# Patient Record
Sex: Male | Born: 1945 | Race: White | Hispanic: No | Marital: Married
Health system: Southern US, Community
[De-identification: ages and names within clinical notes are randomized; demographics above are authoritative.]

---

## 2006-07-27 ENCOUNTER — Encounter (INDEPENDENT_AMBULATORY_CARE_PROVIDER_SITE_OTHER): Payer: Self-pay | Admitting: Specialist

## 2006-07-27 ENCOUNTER — Inpatient Hospital Stay (HOSPITAL_COMMUNITY): Admission: RE | Admit: 2006-07-27 | Discharge: 2006-07-29 | Payer: Self-pay | Admitting: Urology

## 2008-03-10 IMAGING — CR DG CHEST 2V
2 series · 2 of 2 positions shown · non-contrast
Comparison: None.

CLINICAL DATA: Prostate cancer. Preop respiratory exam.
 CHEST - 2 VIEW:

[w chest pa]
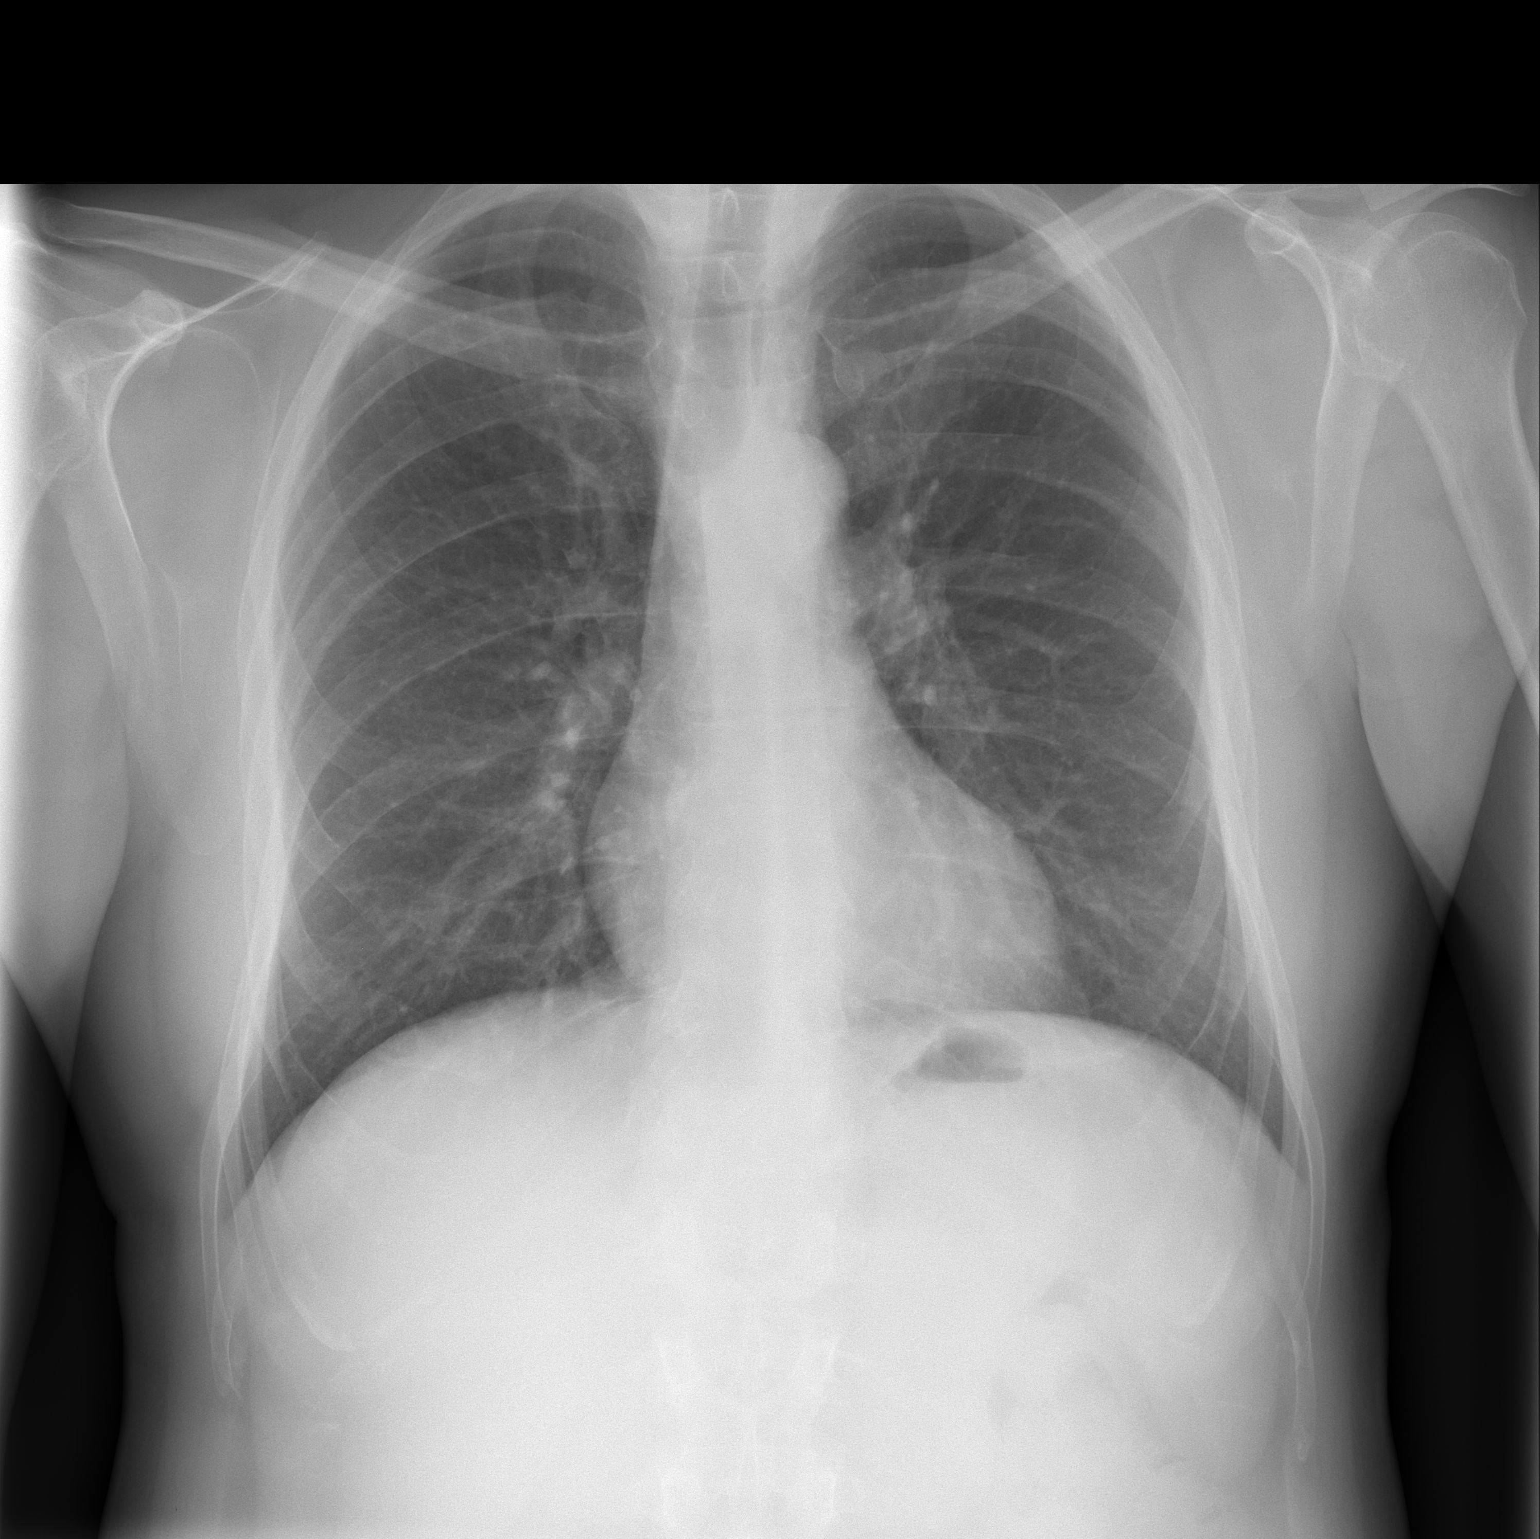

[w chest lat]
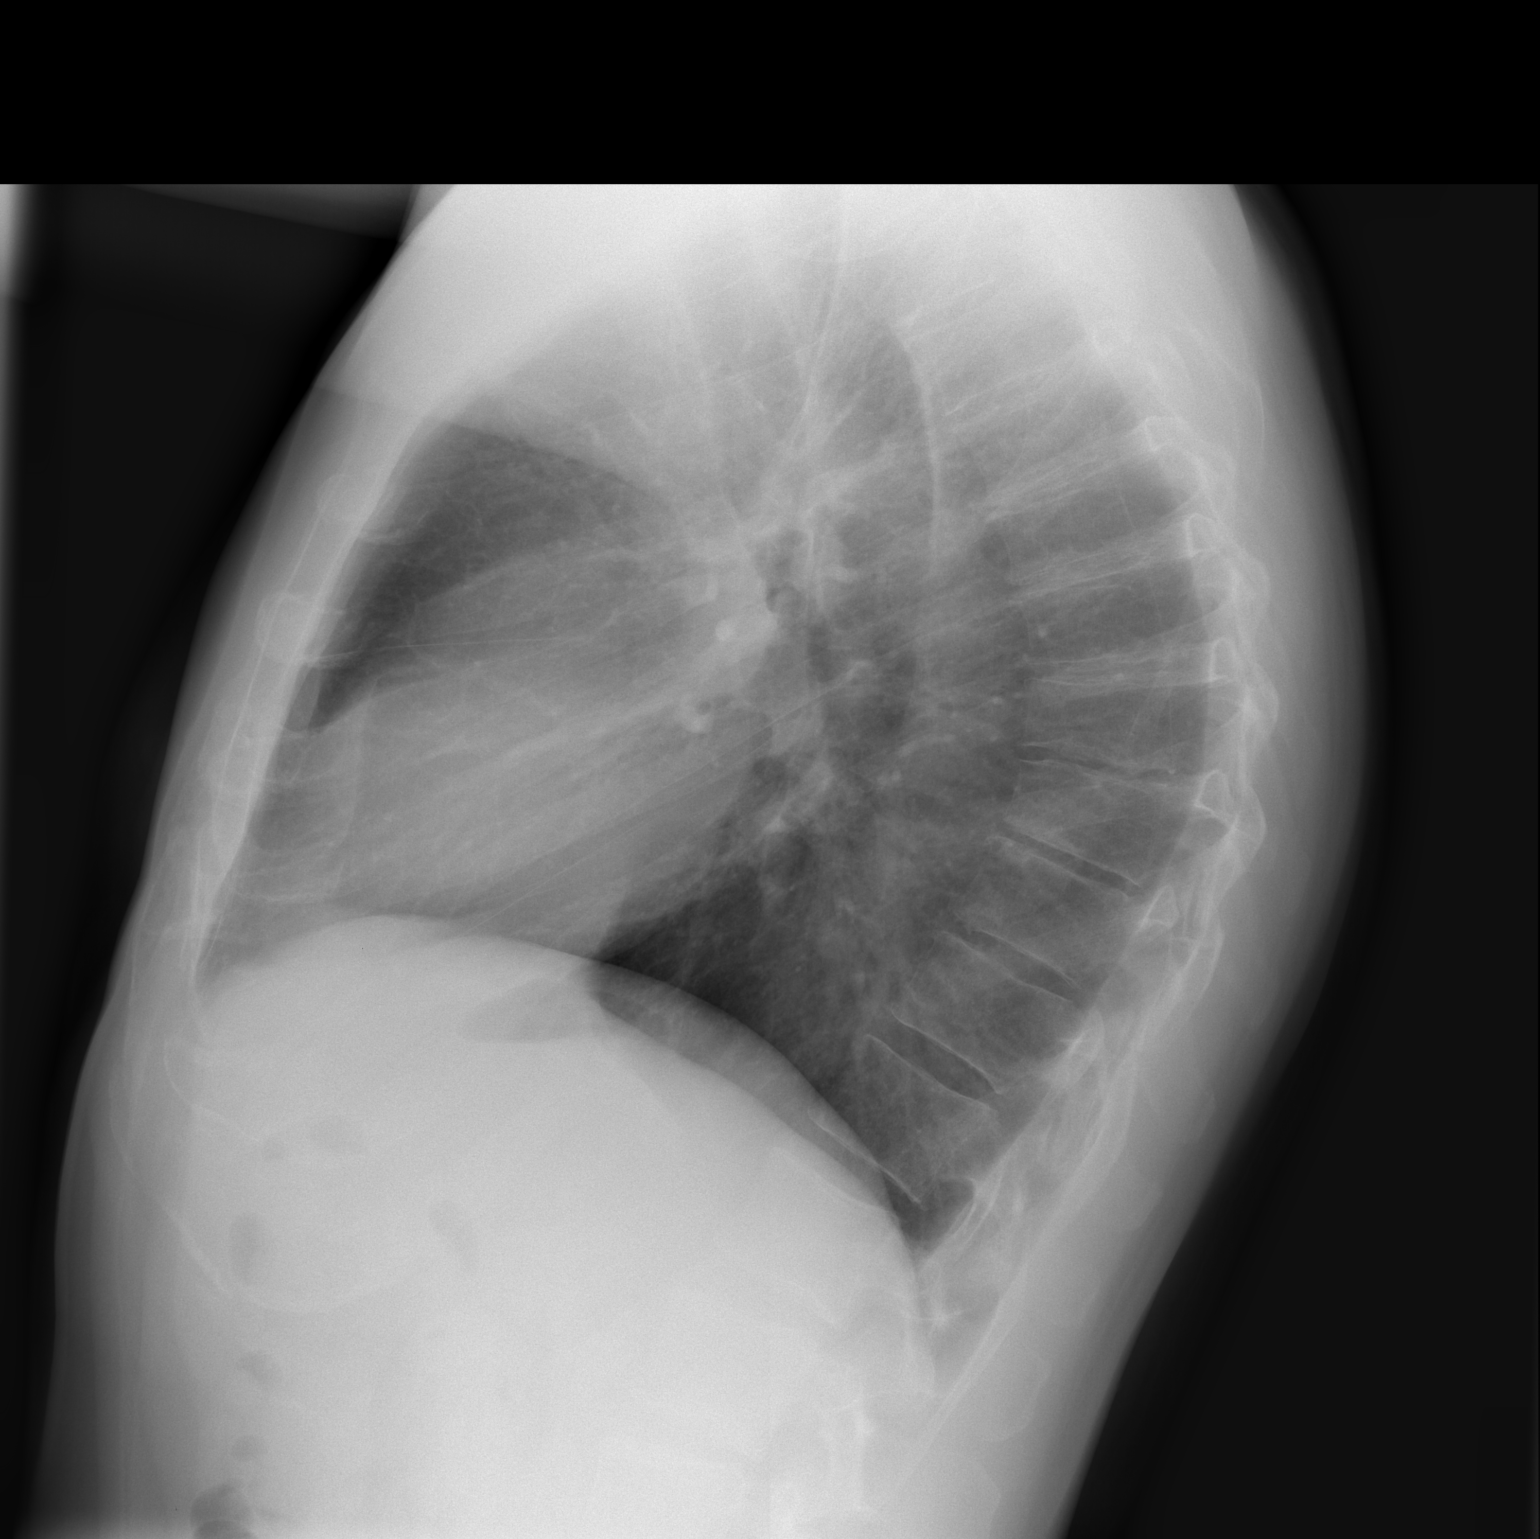

[2 of 2 positions shown; findings below may reference images not displayed]

FINDINGS: The cardiomediastinal contours are normal.  The lungs are clear.  There is no pleural effusion.  Mild anterior wedging of 2 adjacent thoracic vertebral bodies is demonstrated.  There is no evidence of fracture or metastatic disease.
IMPRESSION: No active cardiopulmonary process demonstrated.

## 2008-11-29 ENCOUNTER — Ambulatory Visit (HOSPITAL_BASED_OUTPATIENT_CLINIC_OR_DEPARTMENT_OTHER): Admission: RE | Admit: 2008-11-29 | Discharge: 2008-11-29 | Payer: Self-pay | Admitting: Orthopedic Surgery

## 2010-12-24 LAB — ANAEROBIC CULTURE

## 2010-12-24 LAB — TISSUE CULTURE

## 2011-01-26 NOTE — Op Note (Signed)
Matthew Huffman, Matthew Huffman          ACCOUNT NO.:  [id number]   MEDICAL RECORD NO.:  [id number]          PATIENT TYPE:  AMB   LOCATION:  DSC                          FACILITY:  [facility]   PHYSICIAN:  Katy Fitch. Sypher, M.D. DATE OF BIRTH:  Sep 24, 1945   DATE OF PROCEDURE:  11/29/2008  DATE OF DISCHARGE:                               OPERATIVE REPORT   PREOPERATIVE DIAGNOSES:  Chronic infected/ingrown right thumb nail,  radial nail wall with pain, odor, and drainage with chronic nail lysis  of nail plate on radial aspect of sterile nail matrix.   POSTOPERATIVE DIAGNOSES:  Ingrown nail with nail matrix deformity and  chronic subungual infection.   OPERATION:  1. Resection of the radial 3 mm of nail plate followed by aerobic and      anaerobic culture of granulation tissue deep to nail plate and      radial nail wall.  2. Permanent resection of germinal nail matrix, radial aspect of      distal phalanx and radial nail wall to permanently narrow the right      thumb nail.   OPERATING SURGEON:  Katy Fitch. Sypher, MD   ASSISTANT:  Marveen Reeks Dasnoit, PA-C   ANESTHESIA:  Lidocaine 2% metacarpal head level block without  supplemental sedation.  This was a minor operating room procedure  performed in the [facility] OR.   ANESTHETIST:  Sypher.   INDICATIONS:  Matthew Huffman is a 65 year old gentleman with a  history of a chronic swelling and painful right thumb nail.  He had  chronic lysis of the nail plate with an area of onycholysis and chronic  rubor and swelling along the radial nail wall.  He noted malodorous  drainage and chronic discomfort.  He could not recall specific injury.   He had been seeing a dermatologist and had been treating with Lamisil  without success.   We advised him to proceed with a nail debridement, aerobic and anaerobic  culture, and permanent correction of his ingrown nail predicament.   Preoperatively, he was advised the anticipated procedure,  potential  risks and benefits.  After informed consent, he is brought to the  operating room at this time.   PROCEDURE:  Matthew Huffman was brought to the operating room and  placed in the supine position upon the operating table.   Following Betadine prep, the right thumb was anesthetized with  metacarpal head level block of 2% plain lidocaine.   After 5 minutes, he had complete anesthesia.   The right arm was prepped with Betadine soap solution and sterilely  draped.  The right thumb was exsanguinated with a gauze wrap and a 1/2-  inch Penrose drain placed as a digital tourniquet.   The procedure commenced with resection of the radial 3 mm of nail plate  followed by curettage of the radial nail wall.  Extensive granulation  tissue was recovered.  This was sent off for aerobic and anaerobic  growth.   The wound was then carefully inspected and there was noted to be a  permanent deformity of the radial nail matrix deep to the dorsal nail  fold causing a nail plate fragment to grow into the nail wall.   The dorsal nail fold was split 5 mm proximally and a complete resection  of the dorsal intermediate and ventral nail fold was accomplished along  the radial nail wall permanently narrowing the nail by 2-2.5 mm.  This  was resected down to the periosteum of the distal phalanx and extensor  tendon.  The wound was then debrided, packed open with Xeroflo and the  dorsal nail fold repaired with interrupted suture of 6-0 chromic.   The thumb was dressed with Xeroflo sterile gauze and Coban.  There were  no apparent complications.  Mr. Matthew Huffman tolerated the surgery and  anesthesia well.  He was transferred to recovery room with stable vital  signs.   He will be discharged to the care of his wife with prescription for  Tylenol with Codeine No.3.  As this was an open wound, we were not  providing antibiotics at this time.       Katy Fitch Sypher, M.D.  Electronically  Signed     RVS/MEDQ  D:  11/29/2008  T:  11/30/2008  Job:  [id number]

## 2011-01-29 NOTE — H&P (Signed)
Matthew Huffman, Matthew Huffman          ACCOUNT NO.:  [id number]   MEDICAL RECORD NO.:  [id number]          PATIENT TYPE:  INP   LOCATION:  NA                           FACILITY:  [facility]   PHYSICIAN:  Heloise Purpura, MD      DATE OF BIRTH:  Jul 09, 1946   DATE OF ADMISSION:  07/27/2006  DATE OF DISCHARGE:                                HISTORY & PHYSICAL   CHIEF COMPLAINT:  Prostate cancer.   HISTORY:  Matthew Huffman is a 65 year old gentleman with clinical stage T1-C  prostate cancer with a PSA of 3.6 and Gleason score of 3 + 3 = 6.  After  discussion regarding management options for clinically localized prostate  cancer, the patient elected to proceed with surgical therapy and decided to  have a robotic-assisted laparoscopic radical prostatectomy.   PAST MEDICAL HISTORY:  1. Hypertension.  2. Gastroesophageal reflux disease.   PAST SURGICAL HISTORY:  Circumcision.   MEDICATIONS:  1. Famotidine.  2. Benicar.  3. Fluticasone propionate.  4. Baby aspirin.  5. Fish oil.  6. Niacin.  7. Cinnamon.   ALLERGIES:  No known drug allergies.   FAMILY HISTORY:  Positive for diabetes, hypertension and coronary artery  disease.  There is no history of prostate cancer or GU malignancy in the  family.   SOCIAL HISTORY:  The patient is retired.  He is married.  He denies history  of tobacco use.  He drinks one glass of red wine per day.   PHYSICAL EXAMINATION:  CONSTITUTIONAL:  The patient is a well-nourished,  well-developed, age-appropriate male in no acute distress.  CARDIOVASCULAR:  Regular rate and rhythm without obvious murmurs.  LUNGS:  Clear bilaterally.  ABDOMEN:  Soft, nontender, nondistended without abdominal masses.  DIGITAL RECTAL EXAM:  No prostate nodularity or induration.   IMPRESSION:  Clinically localized prostate cancer.   PLAN:  Matthew Huffman will undergo a robotic-assisted laparoscopic radical  prostatectomy.  He will then be admitted to the hospital for routine  postoperative care.           ______________________________  Heloise Purpura, MD  Electronically Signed     LB/MEDQ  D:  07/26/2006  T:  07/27/2006  Job:  [id number]

## 2011-01-29 NOTE — Discharge Summary (Signed)
Matthew Huffman, SLEIGHT          ACCOUNT NO.:  [id number]   MEDICAL RECORD NO.:  [id number]          PATIENT TYPE:  INP   LOCATION:  1410                         FACILITY[facility]   PHYSICIAN:  Heloise Purpura, MD      DATE OF BIRTH:  08/25/1946   DATE OF ADMISSION:  07/27/2006  DATE OF DISCHARGE:  07/29/2006                                 DISCHARGE SUMMARY   ADMISSION DIAGNOSIS:  Prostate cancer.   DISCHARGE DIAGNOSIS:  Prostate cancer.   HISTORY OF PRESENT ILLNESS:  For full details, please see admission history  and physical.  Briefly, Mr. Leedy is a 65 year old gentleman with  clinical stage T1c prostate cancer with a PSA of 3.6 and Gleason score of 3  + 3 = 6.  After discussing management options for clinically localized  prostate cancer, he elected to proceed with surgical therapy with a robotic-  assisted laparoscopic radical prostatectomy.   HOSPITAL COURSE:  The patient was taken to the operating room on July 27, 2006, and underwent a robotic-assisted laparoscopic radical  prostatectomy.  He tolerated this procedure well without complications.  Postoperatively, he was able to be transferred to a regular hospital room  following recovery from anesthesia.  He was able to begin ambulating the  evening of surgery.  He did have significant nausea throughout the first  postoperative night and well into the morning of postop day #2.  He was  unable to be transferred over to a full liquid diet at this point due to his  nausea.  By the afternoon of postop day #1, his nausea had resolved.  He  began having return of bowel function and was able to begin a clear liquid  diet.  He was monitored overnight on postop day #1 and continued to do okay  on a clear liquid diet.  He maintained excellent urine output and had  minimal output from his pelvic drain which was removed on the morning of  postop day #2.  He was therefore able to be discharged home in excellent  condition  that morning.   DISPOSITION:  Home.   DISCHARGE MEDICATIONS:  The patient was instructed to resume his regular  home medications except any aspirin, nonsteroidal anti-inflammatory drugs,  aspirin or herbal supplements.  He was given a prescription to take Vicodin  as needed for pain, told to take Colace as a stool softener and given a  prescription to begin Cipro 1 day prior to his return visit.   ACTIVITY:  The patient was instructed to be ambulatory, but specifically  told to refrain from any heavy lifting, strenuous activity or driving.  He  was told to gradually advance his diet once passing flatus.  He was also  given instruction on routine Foley catheter care and given a leg bag for  daytime usage.   FOLLOW UP:  Mr. Dimarco will follow up in 1 week for removal of his Foley  catheter.  I will plan to contact him with the results of his surgical  pathology.           ______________________________  Heloise Purpura, MD  Electronically  Signed     LB/MEDQ  D:  07/29/2006  T:  07/30/2006  Job:  [id number]

## 2011-01-29 NOTE — Op Note (Signed)
NAMETYEE, VANDEVOORDE NO.:  [id number]   MEDICAL RECORD NO.:  [id number]          PATIENT TYPE:  INP   LOCATION:  0010                         FACILITY:  [facility]   PHYSICIAN:  Heloise Purpura, MD      DATE OF BIRTH:  23-Mar-1946   DATE OF PROCEDURE:  07/27/2006  DATE OF DISCHARGE:                                 OPERATIVE REPORT   PREOPERATIVE DIAGNOSIS:  Clinically localized adenocarcinoma of prostate.   POSTOPERATIVE DIAGNOSIS:  Clinically localized adenocarcinoma of prostate.   PROCEDURE:  Robotic assisted laparoscopic radical prostatectomy (bilateral  nerve sparing).   SURGEON:  Heloise Purpura, M.D.   ASSISTANT:  Terie Purser, M.D.   ANESTHESIA:  General.   COMPLICATIONS:  None.   ESTIMATED BLOOD LOSS:  100 mL.   INTRAVENOUS FLUIDS:  2000 mL of lactated Ringer's.   SPECIMEN:  Prostate and seminal vesicles.   DRAINS:  1. #19 Blake pelvic drain.  2. 20-French straight catheter.   INDICATIONS:  Mr. Stallbaumer is a 65 year old gentleman with clinical stage  T1C prostate cancer with a PSA of 3.6 and Gleason score of 3+3=6.  Pretreatment IPSS score was 8 with an IIEF score of 23.  After discussing  management options for clinically localized prostate cancer, the patient  elected to proceed with surgical therapy and the above procedure.  The  potential risks and benefits were discussed with the patient and he  consented.   DESCRIPTION OF PROCEDURE:  The patient was taken to the operating room and a  general anesthetic was administered.  He was given preoperative antibiotics,  placed in the dorsal lithotomy position, prepped and draped in the usual  sterile fashion.  Next, a preoperative time-out was performed.  A Foley  catheter was then inserted into the bladder.  A site was then selected 18 cm  from the pubic symphysis and just to the left of the umbilicus for placement  of the camera port.  This was placed using a standard open Hassan technique.  This  allowed entry into the peritoneal cavity under direct vision.  A 12 mm  port was then placed and a pneumoperitoneum was established.  The 0 degrees  lens was used to inspect the abdomen and there was no evidence of any intra-  abdominal injuries or other abnormalities.  Attention was then turned to  placement of the remaining ports.  Bilateral 8 mm robotic ports were placed  16 cm from the pubic symphysis and 10 cm lateral to the camera port.  An  additional 8 mm port was placed in the far left lateral abdominal wall.  A 5  mm port was placed between the camera port and the right robotic port.  An  additional 12 mm port was placed in the far right lateral abdominal wall for  laparoscopic assistance.  All ports were placed under direct vision without  difficulty.  The surgical cart was then docked.   With the aid of the cautery scissors, the bladder was reflected posteriorly  allowing entry in the space of Retzius and identification of the endopelvic  fascia and prostate.  The endopelvic fascia was  then incised along the  length of the prostate from the apex back to the base and the underlying  levator muscle fibers were swept laterally off the prostate.  This isolated  the dorsal venous complex which was then stapled and divided with a 45 mm  flex ETS stapler.  The bladder neck was then identified with the aid of  Foley catheter manipulation.  The anterior bladder neck was incised,  thereby, exposing the Foley catheter.  The catheter balloon was then  deflated and the catheter was brought into the operative field and used to  retract the prostate anteriorly.  The posterior bladder neck was then  divided and dissection continued posteriorly between the bladder neck and  prostate until the vasa deferentia and seminal vesicles were identified.  The vasa deferentia were isolated and divided and lifted anteriorly.  The  seminal vesicles were similarly isolated and dissected free.  Hem-o-Lock   clips were used to control the seminal vesicle arterial blood supply.  The  seminal vesicles were then lifted anteriorly.  The space between  Denonvilliers' fascia and the anterior rectum was then bluntly developed,  thereby, isolating the vascular pedicles of the prostate.  The lateral  prostatic fascia was then incised bilaterally allowing the neurovascular  bundles to be swept laterally and posteriorly off the prostate.  The  vascular pedicles of the prostate were then ligated with Hem-o-Lock clips  and divided with sharp cold scissor dissection.  The neurovascular bundles  were then swept off the apex of the prostate and the urethra.  The urethra  was then sharply divided and the prostate specimen was disarticulated and  placed up into the abdomen for later removal.  The pelvis was then copiously  irrigated and hemostasis was ensured.  There was no evidence of a rectal  injury.   Attention was then turned to the urethral anastomosis.  A 2-0 Vicryl slip-  knot was placed at the 6 o'clock position between the bladder neck and  urethra to reapproximate these structures.  A double-armed 3-0 Monocryl  suture was then used to perform a 360 degrees running tension free  anastomosis between the bladder neck and urethra.  A 20-French Coude  catheter was then inserted into the bladder and irrigated.  There were no  blood clots within the bladder and the anastomosis appeared watertight.  A  #19 Blake drain was then brought through the left robotic port and  appropriately positioned in the pelvis.  It was secured to the skin with a  nylon suture.  The surgical cart was then undocked.  The prostate specimen  was placed into the Endopouch retrieval bag via the periumbilical port site  for later removal.  A 0 Vicryl stitch was then used to close the right  lateral 12 mm port site with the port site closure device.  All ports were then removed under direct vision.  The prostate specimen was removed  intact  within the Endopouch retrieval bag via the periumbilical port site.  All  port sites were then injected with 0.25% Marcaine.  The periumbilical  fascial opening was closed with a running 0 Vicryl suture.  Staples were  used to reapproximate the skin at all incision sites and sterile dressings  were applied.  The patient appeared to tolerate the procedure well without  complications.  He was able to be extubated and transferred to the recovery  unit in satisfactory condition.           ______________________________  Heloise Purpura, MD  Electronically Signed     LB/MEDQ  D:  07/27/2006  T:  07/27/2006  Job:  [id number]

## 2017-07-21 ENCOUNTER — Ambulatory Visit: Payer: Self-pay | Admitting: Sports Medicine

## 2017-12-06 ENCOUNTER — Ambulatory Visit (INDEPENDENT_AMBULATORY_CARE_PROVIDER_SITE_OTHER): Payer: Medicare Other

## 2017-12-06 ENCOUNTER — Ambulatory Visit: Payer: Medicare Other | Admitting: Podiatry

## 2017-12-06 DIAGNOSIS — M7752 Other enthesopathy of left foot: Secondary | ICD-10-CM

## 2017-12-06 DIAGNOSIS — M2580 Other specified joint disorders, unspecified joint: Secondary | ICD-10-CM

## 2017-12-06 DIAGNOSIS — M779 Enthesopathy, unspecified: Secondary | ICD-10-CM

## 2017-12-08 ENCOUNTER — Other Ambulatory Visit: Payer: Self-pay | Admitting: Podiatry

## 2017-12-08 DIAGNOSIS — M79672 Pain in left foot: Secondary | ICD-10-CM

## 2017-12-08 DIAGNOSIS — M779 Enthesopathy, unspecified: Secondary | ICD-10-CM

## 2017-12-09 NOTE — Progress Notes (Signed)
  Subjective:  Patient ID: Matthew Huffman, male    DOB: 12/06/1945,  MRN: [id number]  Chief Complaint  Patient presents with  . Foot Pain    L plantar forefoot below great toe x 2 days; 8/10 sharp pain when walking or applying pressure -no associated injury Tx: none    72 y.o. male presents with the above complaint.  Reports pain to the ball of the left great toe times 2 days.  Unable to walk.  Reports 8-10 sharp pressure.  Unknown injury.  States that he first noticed the pain when he was doing yard work outside.  Denies nausea vomiting fever chills No past medical history on file. No current outpatient medications on file.  Allergies not on file Review of Systems Objective:  There were no vitals filed for this visit. General AA&O x3. Normal mood and affect.  Vascular Dorsalis pedis and posterior tibial pulses  present 2+ bilaterally  Capillary refill normal to all digits. Pedal hair growth normal.  Neurologic Epicritic sensation grossly present.  Dermatologic No open lesions. Interspaces clear of maceration. Nails well groomed and normal in appearance.  Orthopedic: MMT 5/5 in dorsiflexion, plantarflexion, inversion, and eversion. Pain to palpation on range of motion of the left first MPJ Pain to palpation about the left tibial sesamoid   Assessment & Plan:  Patient was evaluated and treated and all questions answered.  Sesamoiditis, sesamoid dislocation -X-rays taken reviewed.  Evidence of sesamoid subluxation. -Surgical shoe dispensed with dancer's pad.  Patient advised to limit activity for the next 2 weeks. -Will proceed with nonoperative therapy at this time should the sesamoid continue to cause issues would consider possible removal.  No follow-ups on file.

## 2017-12-20 ENCOUNTER — Ambulatory Visit: Payer: Medicare Other | Admitting: Podiatry

## 2019-03-01 DIAGNOSIS — M79641 Pain in right hand: Secondary | ICD-10-CM | POA: Insufficient documentation

## 2019-04-26 DIAGNOSIS — M65322 Trigger finger, left index finger: Secondary | ICD-10-CM | POA: Insufficient documentation

## 2020-07-09 DIAGNOSIS — M79641 Pain in right hand: Secondary | ICD-10-CM | POA: Diagnosis not present

## 2020-07-09 DIAGNOSIS — M79642 Pain in left hand: Secondary | ICD-10-CM | POA: Diagnosis not present

## 2020-07-09 DIAGNOSIS — M65352 Trigger finger, left little finger: Secondary | ICD-10-CM | POA: Diagnosis not present

## 2020-09-17 DIAGNOSIS — R058 Other specified cough: Secondary | ICD-10-CM | POA: Diagnosis not present

## 2020-09-17 DIAGNOSIS — U071 COVID-19: Secondary | ICD-10-CM | POA: Diagnosis not present

## 2021-01-29 DIAGNOSIS — I10 Essential (primary) hypertension: Secondary | ICD-10-CM | POA: Diagnosis not present

## 2021-01-29 DIAGNOSIS — Z7689 Persons encountering health services in other specified circumstances: Secondary | ICD-10-CM | POA: Diagnosis not present

## 2021-02-11 DIAGNOSIS — M79671 Pain in right foot: Secondary | ICD-10-CM | POA: Diagnosis not present

## 2021-02-11 DIAGNOSIS — B351 Tinea unguium: Secondary | ICD-10-CM | POA: Diagnosis not present

## 2021-02-11 DIAGNOSIS — M79674 Pain in right toe(s): Secondary | ICD-10-CM | POA: Diagnosis not present

## 2021-03-12 ENCOUNTER — Other Ambulatory Visit: Payer: Self-pay

## 2021-03-12 ENCOUNTER — Ambulatory Visit: Payer: Medicare Other | Admitting: Podiatry

## 2021-03-12 ENCOUNTER — Encounter: Payer: Self-pay | Admitting: Podiatry

## 2021-03-12 DIAGNOSIS — M79676 Pain in unspecified toe(s): Secondary | ICD-10-CM | POA: Diagnosis not present

## 2021-03-12 DIAGNOSIS — L600 Ingrowing nail: Secondary | ICD-10-CM | POA: Diagnosis not present

## 2021-03-12 DIAGNOSIS — M7751 Other enthesopathy of right foot: Secondary | ICD-10-CM

## 2021-03-12 MED ORDER — DEXAMETHASONE SODIUM PHOSPHATE 120 MG/30ML IJ SOLN
2.0000 mg | Freq: Once | INTRAMUSCULAR | Status: AC
  Administered 2021-03-12: 2 mg via INTRA_ARTICULAR

## 2021-03-12 NOTE — Progress Notes (Signed)
  Subjective:  Patient ID: Matthew Huffman, male    DOB: [birth year],  MRN: [id number]  Chief Complaint  Patient presents with   Foot Problem    I have some pain on the 2nd toe right    Nail Problem    The toenails I saw Dr Sherral Hammers and he gave me lamisil and I really do not want to take it due to the liver    75 y.o. male presents with the above complaint. History confirmed with patient.   Objective:  Physical Exam: warm, good capillary refill, no trophic changes or ulcerative lesions, normal DP and PT pulses, and normal sensory exam. Right Foot: POP right 2nd MPJ. Lesser digital contractures.   Pincer nails of b/l hallux and some of lesser digits with ingrowing aspects of nails   Assessment:   1. Capsulitis of metatarsophalangeal (MTP) joint of right foot   2. Ingrown nail   3. Pain around toenail    Plan:  Patient was evaluated and treated and all questions answered.  Capsulitis -Educated on etiology -XR patient had brought reviewed with patient -Discussed padding and proper shoegear -Offloading pad applied -Injection delivered to the painful joint  Procedure: Joint Injection Location: Right 2nd MPJ joint Skin Prep: Alcohol. Injectate: 0.5 cc 1% lidocaine plain, 0.5 cc dexamethasone Disposition: Patient tolerated procedure well. Injection site dressed with a band-aid.  Ingrown Nail -More traumatic changes than fungus. -Palliative debridement of ingrowing nails in slant-back fashion to patient relief.  Return if symptoms worsen or fail to improve.

## 2021-06-04 DIAGNOSIS — M65331 Trigger finger, right middle finger: Secondary | ICD-10-CM | POA: Diagnosis not present

## 2021-06-04 DIAGNOSIS — M7711 Lateral epicondylitis, right elbow: Secondary | ICD-10-CM | POA: Diagnosis not present

## 2021-06-10 DIAGNOSIS — I10 Essential (primary) hypertension: Secondary | ICD-10-CM | POA: Diagnosis not present

## 2021-06-10 DIAGNOSIS — R5383 Other fatigue: Secondary | ICD-10-CM | POA: Diagnosis not present

## 2021-06-10 DIAGNOSIS — Z0000 Encounter for general adult medical examination without abnormal findings: Secondary | ICD-10-CM | POA: Diagnosis not present

## 2021-06-10 DIAGNOSIS — Z13220 Encounter for screening for lipoid disorders: Secondary | ICD-10-CM | POA: Diagnosis not present

## 2021-06-10 DIAGNOSIS — R5381 Other malaise: Secondary | ICD-10-CM | POA: Diagnosis not present

## 2021-06-10 DIAGNOSIS — Z125 Encounter for screening for malignant neoplasm of prostate: Secondary | ICD-10-CM | POA: Diagnosis not present

## 2021-06-22 DIAGNOSIS — H5202 Hypermetropia, left eye: Secondary | ICD-10-CM | POA: Diagnosis not present

## 2021-06-22 DIAGNOSIS — D3132 Benign neoplasm of left choroid: Secondary | ICD-10-CM | POA: Diagnosis not present

## 2021-06-22 DIAGNOSIS — H40023 Open angle with borderline findings, high risk, bilateral: Secondary | ICD-10-CM | POA: Diagnosis not present

## 2021-06-22 DIAGNOSIS — H524 Presbyopia: Secondary | ICD-10-CM | POA: Diagnosis not present

## 2021-06-22 DIAGNOSIS — H52223 Regular astigmatism, bilateral: Secondary | ICD-10-CM | POA: Diagnosis not present

## 2021-06-22 DIAGNOSIS — H43813 Vitreous degeneration, bilateral: Secondary | ICD-10-CM | POA: Diagnosis not present

## 2021-06-22 DIAGNOSIS — H35372 Puckering of macula, left eye: Secondary | ICD-10-CM | POA: Diagnosis not present

## 2021-06-22 DIAGNOSIS — H2513 Age-related nuclear cataract, bilateral: Secondary | ICD-10-CM | POA: Diagnosis not present

## 2021-06-29 ENCOUNTER — Other Ambulatory Visit: Payer: Self-pay

## 2021-06-29 ENCOUNTER — Ambulatory Visit: Payer: Medicare Other | Admitting: Podiatry

## 2021-06-29 DIAGNOSIS — L600 Ingrowing nail: Secondary | ICD-10-CM | POA: Diagnosis not present

## 2021-06-29 DIAGNOSIS — M79676 Pain in unspecified toe(s): Secondary | ICD-10-CM

## 2021-06-29 NOTE — Progress Notes (Signed)
  Subjective:  Patient ID: Matthew Huffman, male    DOB: 1946-09-11,  MRN: [id number]  No chief complaint on file.  75 y.o. male presents with the above complaint. History confirmed with patient. States he has an ingrown to the right great toenail. Has had this many years ago when he was young. Has tried putting cotton in the sides to help the nail not ingrow. Denies other pedal issues, states the area he received an injection last visit has helped.  Objective:  Physical Exam: warm, good capillary refill, no trophic changes or ulcerative lesions, normal DP and PT pulses, and normal sensory exam.  Painful ingrowing nail at both borders of the right, hallux; without warmth, erythema or drainage  Assessment:   1. Ingrown nail   2. Pain around toenail    Plan:  Patient was evaluated and treated and all questions answered.  Ingrown Nail, right -Patient elects to proceed with ingrown toenail removal today -Ingrown nail excised. See procedure note. -Educated on post-procedure care including soaking. Written instructions provided. -Rx for Cortisporin drops -Patient to follow up in 2 weeks for nail check.  Procedure: Excision of Ingrown Toenail Location: Right 1st toe both borders Anesthesia: Lidocaine 1% plain; 85mL, digital block. Skin Prep: Betadine. Dressing: Silvadene; telfa; dry, sterile, compression dressing. Technique: Following skin prep, the toe was exsanguinated and a tourniquet was secured at the base of the toe. The affected nail border was freed, split with a nail splitter, and excised. Chemical matrixectomy was then performed with phenol and irrigated out with alcohol. The tourniquet was then removed and sterile dressing applied. Disposition: Patient tolerated procedure well.

## 2021-06-29 NOTE — Patient Instructions (Signed)

## 2021-07-13 ENCOUNTER — Other Ambulatory Visit: Payer: Self-pay

## 2021-07-13 ENCOUNTER — Ambulatory Visit (INDEPENDENT_AMBULATORY_CARE_PROVIDER_SITE_OTHER): Payer: Medicare Other | Admitting: Podiatry

## 2021-07-13 DIAGNOSIS — M79676 Pain in unspecified toe(s): Secondary | ICD-10-CM

## 2021-07-13 DIAGNOSIS — L600 Ingrowing nail: Secondary | ICD-10-CM | POA: Diagnosis not present

## 2021-07-13 NOTE — Progress Notes (Signed)
  Subjective:  Patient ID: Matthew Huffman, male    DOB: 1946-03-11,  MRN: [id number]  Chief Complaint  Patient presents with   nail check    F/U Rt hallux nail check -pt state," doing good.": -pt denies pain/redness/swelling - with little drainage tx: neospoprin and bandiad    75 y.o. male presents for follow up of nail procedure. History confirmed with patient.   Objective:  Physical Exam: Ingrown nail avulsion site: overlying soft crust, no warmth, no drainage, and no erythema Assessment:   1. Ingrown nail   2. Pain around toenail      Plan:  Patient was evaluated and treated and all questions answered.  S/p Ingrown Toenail Excision, right -Healing well without issue. -Discussed return precautions. -F/u PRN

## 2021-07-17 DIAGNOSIS — M65351 Trigger finger, right little finger: Secondary | ICD-10-CM | POA: Diagnosis not present

## 2021-07-17 DIAGNOSIS — M65141 Other infective (teno)synovitis, right hand: Secondary | ICD-10-CM | POA: Diagnosis not present

## 2021-07-17 DIAGNOSIS — M65331 Trigger finger, right middle finger: Secondary | ICD-10-CM | POA: Diagnosis not present

## 2021-07-17 DIAGNOSIS — M65321 Trigger finger, right index finger: Secondary | ICD-10-CM | POA: Diagnosis not present

## 2021-07-17 DIAGNOSIS — M25641 Stiffness of right hand, not elsewhere classified: Secondary | ICD-10-CM | POA: Diagnosis not present

## 2021-07-30 DIAGNOSIS — M25641 Stiffness of right hand, not elsewhere classified: Secondary | ICD-10-CM | POA: Diagnosis not present
